# Patient Record
Sex: Male | Born: 2000 | Race: White | Hispanic: No | Marital: Single
Health system: Midwestern US, Academic
[De-identification: ages and names within clinical notes are randomized; demographics above are authoritative.]

## PROBLEM LIST (undated history)

## (undated) DIAGNOSIS — F32A Depression, unspecified: Secondary | ICD-10-CM

## (undated) DIAGNOSIS — F429 Obsessive-compulsive disorder, unspecified: Secondary | ICD-10-CM

## (undated) DIAGNOSIS — F419 Anxiety disorder, unspecified: Secondary | ICD-10-CM

## (undated) DIAGNOSIS — T7840XA Allergy, unspecified, initial encounter: Secondary | ICD-10-CM

## (undated) DIAGNOSIS — F909 Attention-deficit hyperactivity disorder, unspecified type: Secondary | ICD-10-CM

## (undated) DIAGNOSIS — F1910 Other psychoactive substance abuse, uncomplicated: Secondary | ICD-10-CM

## (undated) DIAGNOSIS — R569 Unspecified convulsions: Secondary | ICD-10-CM

## (undated) HISTORY — DX: Allergy, unspecified, initial encounter: T78.40XA

## (undated) HISTORY — PX: URETHRA SURGERY: SHX824

## (undated) HISTORY — DX: Depression, unspecified: F32.A

## (undated) HISTORY — DX: Other psychoactive substance abuse, uncomplicated: F19.10

## (undated) HISTORY — DX: Unspecified convulsions: R56.9

---
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
## 2020-03-13 ENCOUNTER — Ambulatory Visit (INDEPENDENT_AMBULATORY_CARE_PROVIDER_SITE_OTHER): Payer: 59 | Admitting: Neurology

## 2020-03-13 ENCOUNTER — Other Ambulatory Visit: Payer: Self-pay

## 2020-03-13 ENCOUNTER — Encounter: Payer: Self-pay | Admitting: Neurology

## 2020-03-13 VITALS — BP 121/82 | HR 74 | Resp 18 | Ht 67.0 in | Wt 176.0 lb

## 2020-03-13 DIAGNOSIS — R569 Unspecified convulsions: Secondary | ICD-10-CM | POA: Diagnosis not present

## 2020-03-13 NOTE — Progress Notes (Signed)
NEUROLOGY FOLLOW UP OFFICE NOTE  Edward Hardin [id number] 04-Jan-2001  HISTORY OF PRESENT ILLNESS: I had the pleasure of seeing Edward Hardin in follow-up in the neurology clinic on 03/13/2020.  The patient was last seen on 4 months ago for new onset seizure after a fall last 11/16/19. He is accompanied by his mother who helps supplement the history today.  Records and images were personally reviewed where available.  I personally reviewed MRI brain with and without contrast done 12/2019 which was normal, hippocampi symmetric with no abnormal signal or enhancement seen. His 1-hour wake and sleep EEG was normal. He has been doing well with no further seizures or seizure-like symptoms since 11/2019. He and his mother deny any staring/unresponsive episodes, gaps in time, olfactory/gustatory hallucinations, focal numbness/tingling/weakness, myoclonic jerks. No headaches, dizziness, vision changes, no further falls.   History on Initial Assessment 11/21/2019: This is a pleasant 19 year old man with a history of anxiety, ADHD, presenting for evaluation of new onset seizure. He was skateboarding last 11/16/19 and recalls he was feeling tired/fatigued. He apparently fell down while skateboarding and was told by his friend to sit down. His friend told him he hit his head but he did not think so. After he sat down, he recalls feeling dizzy looking at his injured right hand, then lost consciousness in the grass and was told his body started shaking with apnea. No tongue bite or incontinence. When EMS arrived, there were no further convulsions but he was noted to have a fixed gaze and needed assisted ventilations. He woke up in the ambulance and recalls feeling confused, no focal weakness. Per EMS he was alert and oriented for the rest of the trip to the ER. He reported he had not been eating well recently and was not sleeping well because he was forgetting to take his Trazodone. He denies any alcohol intake. He denies  any illicit drug use. He was in the ER in February 2021 after drug overdose with Xanax likely laced with Fentanyl. He had apparently been sober for 8 months until that day. He was admitted to inpatient Psychiatry at that time. He states mood is fine and denies any Xanax intake. No UDS done in the ER last week. He denies any prior history of seizures. Levetiracetam is on his medication list but he is not taking it. It appears to have been prescribed in March, but there is no documentation available on who/why this was prescribed, it was not on his medication list in the ER last week and was not prescribed at that time either. He denies any staring/unresponsive episodes, gaps in time, olfactory/gustatory hallucinations, deja vu, rising epigastric sensation, focal numbness/tingling/weakness, myoclonic jerks. He denies any headaches, dizziness, vision changes, neck/back pain, bowel/bladder dysfunction. Memory is all right, he is trying to finish up his senior year and needs to take a few summer classes to finish. He was born 2 months premature, he had a normal early development.  There is no history of febrile convulsions, CNS infections such as meningitis/encephalitis, significant traumatic brain injury, neurosurgical procedures, or family history of seizures.   PAST MEDICAL HISTORY: Past Medical History:  Diagnosis Date  . ADHD   . Anxiety   . OCD (obsessive compulsive disorder)     MEDICATIONS: Current Outpatient Medications on File Prior to Visit  Medication Sig Dispense Refill  . cloNIDine (CATAPRES) 0.1 MG tablet Take 0.1 mg by mouth 3 (three) times daily as needed.    Marland Kitchen EPINEPHrine (AUVI-Q) 0.3 mg/0.3  mL IJ SOAJ injection Inject 0.3 mLs (0.3 mg total) into the muscle once for 1 dose. 0.3 mL 0  . levalbuterol (XOPENEX HFA) 45 MCG/ACT inhaler Inhale 2 puffs into the lungs every 6 (six) hours as needed for wheezing. (Patient not taking: Reported on 11/21/2019) 1 Inhaler 11  . traZODone (DESYREL) 50  MG tablet Take 50 mg by mouth at bedtime as needed.    . tretinoin (RETIN-A) 0.025 % cream Apply topically at bedtime.     No current facility-administered medications on file prior to visit.    ALLERGIES: Allergies  Allergen Reactions  . Ciprofloxacin   . Other     Blueberries, tree nuts, honey  . Penicillins     FAMILY HISTORY: Family History  Problem Relation Age of Onset  . Ovarian cancer Maternal Grandmother   . Diabetes Mellitus II Maternal Grandfather     SOCIAL HISTORY: Social History   Socioeconomic History  . Marital status: Single    Spouse name: Not on file  . Number of children: Not on file  . Years of education: Not on file  . Highest education level: Not on file  Occupational History  . Not on file  Tobacco Use  . Smoking status: Never Smoker  . Smokeless tobacco: Never Used  Vaping Use  . Vaping Use: Every day  Substance and Sexual Activity  . Alcohol use: No  . Drug use: Yes    Types: Marijuana  . Sexual activity: Not on file  Other Topics Concern  . Not on file  Social History Narrative  . Not on file   Social Determinants of Health   Financial Resource Strain:   . Difficulty of Paying Living Expenses: Not on file  Food Insecurity:   . Worried About Programme researcher, broadcasting/film/video in the Last Year: Not on file  . Ran Out of Food in the Last Year: Not on file  Transportation Needs:   . Lack of Transportation (Medical): Not on file  . Lack of Transportation (Non-Medical): Not on file  Physical Activity:   . Days of Exercise per Week: Not on file  . Minutes of Exercise per Session: Not on file  Stress:   . Feeling of Stress : Not on file  Social Connections:   . Frequency of Communication with Friends and Family: Not on file  . Frequency of Social Gatherings with Friends and Family: Not on file  . Attends Religious Services: Not on file  . Active Member of Clubs or Organizations: Not on file  . Attends Banker Meetings: Not on file    . Marital Status: Not on file  Intimate Partner Violence:   . Fear of Current or Ex-Partner: Not on file  . Emotionally Abused: Not on file  . Physically Abused: Not on file  . Sexually Abused: Not on file     PHYSICAL EXAM: Vitals:   03/13/20 1411  BP: 121/82  Pulse: 74  Resp: 18  SpO2: 97%   General: No acute distress Head:  Normocephalic/atraumatic Skin/Extremities: No rash, no edema Neurological Exam: alert and oriented to person, place, and time. No aphasia or dysarthria. Fund of knowledge is appropriate.  Recent and remote memory are intact.  Attention and concentration are normal.  Cranial nerves: Pupils equal, round. Extraocular movements intact with no nystagmus. Visual fields full.  No facial asymmetry.  Motor: Bulk and tone normal, muscle strength 5/5 throughout with no pronator drift.  Finger to nose testing intact.  Gait narrow-based  and steady, able to tandem walk adequately.     IMPRESSION: This is a pleasant 19 yo RH man with a history of anxiety, ADHD, with new onset seizure last 11/16/19 after a fall. MRI brain and 1-hour EEG were normal. No further seizures or seizure-like symptoms since 11/2019. Seizure may have been post-traumatic after mild head injury and does not necessitate antiepileptic treatment. We again discussed that after an initial seizure, unless there are significant risk factors, an abnormal neurological exam, an EEG showing epileptiform abnormalities, and/or abnormal neuroimaging, treatment with an antiepileptic drug is not indicated. The patient was instructed to call with recurrent events, as in that case treatment and further diagnostic workup may be indicated. We discussed avoidance of seizure triggers, including sleep deprivation, alcohol, illicit drug use. He is aware of [place] driving restrictions to stop driving until 6 months seizure-free. Follow-up as needed, they know to call for any changes.   Thank you for allowing me to participate in his care.   Please do not hesitate to call for any questions or concerns.   Patrcia Dolly, M.D.   CC: Dr. Artis Flock

## 2020-03-13 NOTE — Patient Instructions (Signed)
Good to see you! Glad to hear you are doing well. Follow-up as needed, call for any changes.   Seizure Precautions: 1. If medication has been prescribed for you to prevent seizures, take it exactly as directed.  Do not stop taking the medicine without talking to your doctor first, even if you have not had a seizure in a long time.   2. Avoid activities in which a seizure would cause danger to yourself or to others.  Don't operate dangerous machinery, swim alone, or climb in high or dangerous places, such as on ladders, roofs, or girders.  Do not drive unless your doctor says you may.  3. If you have any warning that you may have a seizure, lay down in a safe place where you can't hurt yourself.    4.  No driving for 6 months from last seizure, as per [facility].   Please refer to the following link on the Epile[employer] of [employer]'s website for more information: http://www.epilepsyfoundation.org/answerplace/Social/driving/drivingu.cfm   5.  Maintain good sleep hygiene. Avoid alcohol.  6.  Contact your doctor if you have any problems that may be related to the medicine you are taking.  7.  Call 911 and bring the patient back to the ED if:        A.  The seizure lasts longer than 5 minutes.       B.  The patient doesn't awaken shortly after the seizure  C.  The patient has new problems such as difficulty seeing, speaking or moving  D.  The patient was injured during the seizure  E.  The patient has a temperature over 102 F (39C)  F.  The patient vomited and now is having trouble breathing

## 2020-05-26 ENCOUNTER — Other Ambulatory Visit: Payer: Self-pay

## 2020-05-26 ENCOUNTER — Encounter (HOSPITAL_BASED_OUTPATIENT_CLINIC_OR_DEPARTMENT_OTHER): Payer: Self-pay | Admitting: Emergency Medicine

## 2020-05-26 ENCOUNTER — Emergency Department (HOSPITAL_BASED_OUTPATIENT_CLINIC_OR_DEPARTMENT_OTHER)
Admission: EM | Admit: 2020-05-26 | Discharge: 2020-05-26 | Disposition: A | Discharge status: Home / Self Care | Payer: 59 | Attending: Emergency Medicine | Admitting: Emergency Medicine

## 2020-05-26 DIAGNOSIS — W268XXA Contact with other sharp object(s), not elsewhere classified, initial encounter: Secondary | ICD-10-CM | POA: Insufficient documentation

## 2020-05-26 DIAGNOSIS — Z23 Encounter for immunization: Secondary | ICD-10-CM | POA: Diagnosis not present

## 2020-05-26 DIAGNOSIS — S61213A Laceration without foreign body of left middle finger without damage to nail, initial encounter: Secondary | ICD-10-CM | POA: Diagnosis not present

## 2020-05-26 DIAGNOSIS — J4520 Mild intermittent asthma, uncomplicated: Secondary | ICD-10-CM | POA: Diagnosis not present

## 2020-05-26 DIAGNOSIS — S6992XA Unspecified injury of left wrist, hand and finger(s), initial encounter: Secondary | ICD-10-CM | POA: Diagnosis present

## 2020-05-26 MED ORDER — LIDOCAINE HCL 2 % IJ SOLN
10.0000 mL | Freq: Once | INTRAMUSCULAR | Status: AC
  Administered 2020-05-26: 200 mg via INTRADERMAL
  Filled 2020-05-26: qty 20

## 2020-05-26 MED ORDER — ACETAMINOPHEN 325 MG PO TABS
650.0000 mg | ORAL_TABLET | Freq: Once | ORAL | Status: AC
  Administered 2020-05-26: 650 mg via ORAL
  Filled 2020-05-26: qty 2

## 2020-05-26 MED ORDER — TETANUS-DIPHTH-ACELL PERTUSSIS 5-2.5-18.5 LF-MCG/0.5 IM SUSY
0.5000 mL | PREFILLED_SYRINGE | Freq: Once | INTRAMUSCULAR | Status: AC
  Administered 2020-05-26: 0.5 mL via INTRAMUSCULAR
  Filled 2020-05-26: qty 0.5

## 2020-05-26 NOTE — Discharge Instructions (Signed)
Please follow-up for suture removal at either urgent care ,the emergency department, or your primary care doctor in 7-10 days.  Please return to the emergency room immediately if you experience any new or worsening symptoms or any symptoms that indicate worsening infection such as fevers, increased redness/swelling/pain, warmth, or drainage from the affected area.   

## 2020-05-26 NOTE — ED Triage Notes (Signed)
Pt c/o laceration to left middle finger while doing dishes today.

## 2020-05-26 NOTE — ED Provider Notes (Signed)
[facility] EMERGENCY DEPARTMENT Provider Note   CSN: [id number] Arrival date & time: 05/26/20  1326     History Chief Complaint  Patient presents with  . Finger Injury    Edward Hardin is a 19 y.o. male.  HPI   19 y/o male with a h/o ADHD, anxiety, OCD who presents to the ED today for eval of finger laceration. States he was washing dishes pta when he cut his middle finger while washing dishes. Laceration located to the tip of the left middle finger. Normal sensation throughout. Normal rom. tdap 5 years ago.   Past Medical History:  Diagnosis Date  . ADHD   . Anxiety   . OCD (obsessive compulsive disorder)     Patient Active Problem List   Diagnosis Date Noted  . Vitamin D deficiency 11/22/2019  . Asthma, mild intermittent 06/17/2018  . Drug abuse (HCC) 06/17/2018  . Urethral obstruction 05/16/2018  . Keratosis pilaris 04/15/2018  . ADHD 01/13/2018  . Anxiety 01/13/2018  . OCD (obsessive compulsive disorder) 01/13/2018    Past Surgical History:  Procedure Laterality Date  . 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 Ovarian cancer Maternal Grandmother   . Diabetes Mellitus II Maternal Grandfather     Social History   Tobacco Use  . Smoking status: Never Smoker  . Smokeless tobacco: Never Used  Vaping Use  . Vaping Use: Every day  Substance Use Topics  . Alcohol use: No  . Drug use: Yes    Types: Marijuana    Home Medications Prior to Admission medications   Medication Sig Start Date End Date Taking? Authorizing Provider  cloNIDine (CATAPRES) 0.1 MG tablet Take 0.1 mg by mouth 3 (three) times daily as needed. Patient not taking: Reported on 03/13/2020 10/03/19   [provider]  EPINEPHrine (AUVI-Q) 0.3 mg/0.3 mL IJ SOAJ injection Inject 0.3 mLs (0.3 mg total) into the muscle once for 1 dose. 04/21/19 11/22/19  Orland Mustard, MD  levalbuterol [facility] HFA) 45  MCG/ACT inhaler Inhale 2 puffs into the lungs every 6 (six) hours as needed for wheezing. Patient not taking: Reported on 11/21/2019 04/10/19   Orland Mustard, MD  traZODone (DESYREL) 50 MG tablet Take 50 mg by mouth at bedtime as needed. Patient not taking: Reported on 03/13/2020 10/16/19   [provider]  tretinoin (RETIN-A) 0.025 % cream Apply topically at bedtime. Patient not taking: Reported on 03/13/2020    [provider]    Allergies    Ciprofloxacin, Other, and Penicillins  Review of Systems   Review of Systems  Constitutional: Negative for fever.  Musculoskeletal:       Finger pain/wound  Skin: Positive for wound.  Neurological: Negative for weakness and numbness.    Physical Exam Updated Vital Signs BP 125/70 (BP Location: Right Arm)   Pulse 67   Temp (!) 97.5 F (36.4 C) (Oral)   Resp 18   Ht 5\' 8"  (1.727 m)   Wt 73.9 kg   SpO2 99%   BMI 24.78 kg/m   Physical Exam Constitutional:      General: He is not in acute distress.    Appearance: He is well-developed.  Eyes:     Conjunctiva/sclera: Conjunctivae normal.  Cardiovascular:     Rate and Rhythm: Normal rate.  Pulmonary:     Effort: Pulmonary effort is normal.  Musculoskeletal:  Comments: 1cm shaped laceration to the left middle finger. Brisk cap refill distally. Normal distal sensation and ROM.   Skin:    General: Skin is warm and dry.  Neurological:     Mental Status: He is alert and oriented to person, place, and time.     ED Results / Procedures / Treatments   Labs (all labs ordered are listed, but only abnormal results are displayed) Labs Reviewed - No data to display  EKG None  Radiology No results found.  Procedures .Marland KitchenLaceration Repair  Date/Time: 05/26/2020 3:25 PM Performed by: Karrie Meres, PA-C Authorized by: Karrie Meres, PA-C   Consent:    Consent obtained:  Verbal   Consent given by:  Patient   Risks discussed:  Infection and pain    Alternatives discussed:  No treatment Anesthesia (see MAR for exact dosages):    Anesthesia method:  Nerve block   Block needle gauge:  25 G   Block anesthetic:  Lidocaine 1% w/o epi   Block injection procedure:  Anatomic landmarks identified, introduced needle, incremental injection, negative aspiration for blood and anatomic landmarks palpated   Block outcome:  Anesthesia achieved Laceration details:    Location:  Finger   Finger location:  L long finger   Length (cm):  1 Repair type:    Repair type:  Simple Pre-procedure details:    Preparation:  Patient was prepped and draped in usual sterile fashion and imaging obtained to evaluate for foreign bodies Exploration:    Hemostasis achieved with:  Tourniquet and direct pressure   Wound exploration: wound explored through full range of motion and entire depth of wound probed and visualized     Contaminated: no   Treatment:    Area cleansed with:  Saline and Hibiclens   Amount of cleaning:  Extensive   Irrigation solution:  Sterile saline   Irrigation volume:  1L   Irrigation method:  Pressure wash   Visualized foreign bodies/material removed: no   Skin repair:    Repair method:  Sutures   Suture size:  5-0   Suture material:  Prolene   Suture technique:  Simple interrupted   Number of sutures:  3 Approximation:    Approximation:  Close Post-procedure details:    Dressing:  Tube gauze   Patient tolerance of procedure:  Tolerated well, no immediate complications   (including critical care time)  Medications Ordered in ED Medications  Tdap (BOOSTRIX) injection 0.5 mL (0.5 mLs Intramuscular Given 05/26/20 1403)  lidocaine (XYLOCAINE) 2 % (with pres) injection 200 mg (200 mg Intradermal Given by Other 05/26/20 1404)  acetaminophen (TYLENOL) tablet 650 mg (650 mg Oral Given 05/26/20 1402)    ED Course  I have reviewed the triage vital signs and the nursing notes.  Pertinent labs & imaging results that were available during  my care of the patient were reviewed by me and considered in my medical decision making (see chart for details).    MDM Rules/Calculators/A&P                          Pressure irrigation performed. Wound explored and base of wound visualized in a bloodless field without evidence of foreign body.  Laceration occurred < 8 hours prior to repair which was well tolerated. Tdap updated.  Pt has  no comorbidities to effect normal wound healing. Pt discharged  without antibiotics.  Discussed suture home care with patient and answered questions. Pt to follow-up for  wound check and suture removal in 7 days; they are to return to the ED sooner for signs of infection. Pt is hemodynamically stable with no complaints prior to dc.   Final Clinical Impression(s) / ED Diagnoses Final diagnoses:  Laceration of left middle finger without foreign body without damage to nail, initial encounter    Rx / DC Orders ED Discharge Orders    None       Karrie Meres, PA-C 05/26/20 1526    Arby Barrette, MD 05/27/20 [phone]

## 2020-06-03 ENCOUNTER — Ambulatory Visit (INDEPENDENT_AMBULATORY_CARE_PROVIDER_SITE_OTHER): Payer: 59 | Admitting: Family Medicine

## 2020-06-03 ENCOUNTER — Other Ambulatory Visit: Payer: Self-pay

## 2020-06-03 ENCOUNTER — Encounter: Payer: Self-pay | Admitting: Family Medicine

## 2020-06-03 VITALS — BP 110/70 | HR 85 | Temp 98.3°F | Wt 166.4 lb

## 2020-06-03 DIAGNOSIS — S61219A Laceration without foreign body of unspecified finger without damage to nail, initial encounter: Secondary | ICD-10-CM | POA: Diagnosis not present

## 2020-06-03 DIAGNOSIS — S61219S Laceration without foreign body of unspecified finger without damage to nail, sequela: Secondary | ICD-10-CM

## 2020-06-03 MED ORDER — MUPIROCIN 2 % EX OINT: 1.0000 "application " | TOPICAL_OINTMENT | Freq: Two times a day (BID) | CUTANEOUS | 0 refills | Status: DC

## 2020-06-03 NOTE — Progress Notes (Signed)
Patient: Edward Hardin MRN: [id number] DOB: 2000/12/13 PCP: Edward Mustard, MD     Subjective:  Chief Complaint  Patient presents with   Suture / Staple Removal    left middle finger, sliced finger while cleaning a blender, stitches placed 9 days ago    HPI: The patient is a 19 y.o. male who presents today for suture removal. He sliced his left middle finger 9 days ago while cleaning a blender. Went to [place] ER on 11/21 and had cut sutured. Per ER note he had a 1cm laceration to the left middle finger iwht normal cap refill. Normal sensation. Was given tdap in ER on 05/26/20. 3 simple interrupted sutures placed. He denies any drainage, pain or bleeding from site. Has full rom and movement. Has decreased sensation at tip, but no numbness.   Er notes reviewed.   Review of Systems  Constitutional: Negative for chills, fatigue and fever.  Respiratory: Negative for cough and shortness of breath.   Cardiovascular: Negative for chest pain and palpitations.  Skin: Positive for wound. Negative for color change and rash.  Neurological: Negative for seizures and numbness.    Allergies Patient is allergic to ciprofloxacin, other, and penicillins.  Past Medical History Patient  has a past medical history of ADHD, Anxiety, and OCD (obsessive compulsive disorder).  Surgical History Patient  has a past surgical history that includes Urethra surgery.  Family History Pateint's family history includes Diabetes Mellitus II in his maternal grandfather; Ovarian cancer in his maternal grandmother.  Social History Patient  reports that he has never smoked. He has never used smokeless tobacco. He reports current drug use. Drug: Marijuana. He reports that he does not drink alcohol.    Objective: Vitals:   06/03/20 1411  BP: 110/70  Pulse: 85  Temp: 98.3 F (36.8 C)  TempSrc: Temporal  SpO2: 97%  Weight: 166 lb 6.4 oz (75.5 kg)    Body mass index is 25.3 kg/m.  Physical  Exam Vitals reviewed.  Constitutional:      Appearance: Normal appearance. He is normal weight.  HENT:     Head: Normocephalic and atraumatic.  Pulmonary:     Effort: Pulmonary effort is normal.  Skin:    Comments: Left third finger: tip of finger has 3 simple interrupted sutures. No drainage/bleeding. No edema or erythema.   Neurological:     Mental Status: He is alert.    Procedure note Verbal consent obtained to remove sutures. Sutures easily removed with forceps and scissors. Tolerated well. Cleaned with alcohol and bandaid applied. Wound care given.     Assessment/plan: 1. Finger laceration, sequela Well healed. Sutures removed. Tolerated well. bactroban to area bid for a few days and then is good. Suture removal instructions given.     This visit occurred during the SARS-CoV-2 public health emergency.  Safety protocols were in place, including screening questions prior to the visit, additional usage of staff PPE, and extensive cleaning of exam room while observing appropriate contact time as indicated for disinfecting solutions.     Return if symptoms worsen or fail to improve.     Edward Mustard, MD [facility]  06/03/2020

## 2020-06-03 NOTE — Patient Instructions (Signed)
-bactroban twice a day for a few week.  Looks good.    Suture Removal, Care After This sheet gives you information about how to care for yourself after your procedure. Your health care provider may also give you more specific instructions. If you have problems or questions, contact your health care provider. What can I expect after the procedure? After your stitches (sutures) are removed, it is common to have:  Some discomfort and swelling in the area.  Slight redness in the area. Follow these instructions at home: If you have a bandage:  Wash your hands with soap and water before you change your bandage (dressing). If soap and water are not available, use hand sanitizer.  Change your dressing as told by your health care provider. If your dressing becomes wet or dirty, or develops a bad smell, change it as soon as possible.  If your dressing sticks to your skin, soak it in warm water to loosen it. Wound care   Check your wound every day for signs of infection. Check for: ? More redness, swelling, or pain. ? Fluid or blood. ? Warmth. ? Pus or a bad smell.  Wash your hands with soap and water before and after touching your wound.  Apply cream or ointment only as directed by your health care provider. If you are using cream or ointment, wash the area with soap and water 2 times a day to remove all the cream or ointment. Rinse off the soap and pat the area dry with a clean towel.  If you have skin glue or adhesive strips on your wound, leave these closures in place. They may need to stay in place for 2 weeks or longer. If adhesive strip edges start to loosen and curl up, you may trim the loose edges. Do not remove adhesive strips completely unless your health care provider tells you to do that.  Keep the wound area dry and clean. Do not take baths, swim, or use a hot tub until your health care provider approves.  Continue to protect the wound from injury.  Do not pick at your  wound. Picking can cause an infection.  When your wound has completely healed, wear sunscreen over it or cover it with clothing when you are outside. New scars get sunburned easily, which can make scarring worse. General instructions  Take over-the-counter and prescription medicines only as told by your health care provider.  Keep all follow-up visits as told by your health care provider. This is important. Contact a health care provider if:  You have redness, swelling, or pain around your wound.  You have fluid or blood coming from your wound.  Your wound feels warm to the touch.  You have pus or a bad smell coming from your wound.  Your wound opens up. Get help right away if:  You have a fever.  You have redness that is spreading from your wound. Summary  After your sutures are removed, it is common to have some discomfort and swelling in the area.  Wash your hands with soap and water before you change your bandage (dressing).  Keep the wound area dry and clean. Do not take baths, swim, or use a hot tub until your health care provider approves. This information is not intended to replace advice given to you by your health care provider. Make sure you discuss any questions you have with your health care provider. Document Revised: 06/04/2017 Document Reviewed: 07/28/2016 Elsevier Patient Education  2020 [employer].

## 2020-07-19 ENCOUNTER — Other Ambulatory Visit: Payer: Self-pay

## 2020-07-19 ENCOUNTER — Encounter (HOSPITAL_COMMUNITY): Payer: Self-pay | Admitting: *Deleted

## 2020-07-19 ENCOUNTER — Ambulatory Visit (HOSPITAL_COMMUNITY)
Admission: EM | Admit: 2020-07-19 | Discharge: 2020-07-19 | Disposition: A | Discharge status: Home / Self Care | Payer: 59

## 2020-07-19 DIAGNOSIS — T148XXA Other injury of unspecified body region, initial encounter: Secondary | ICD-10-CM | POA: Diagnosis not present

## 2020-07-19 NOTE — ED Provider Notes (Signed)
[facility]    CSN: [id number] Arrival date & time: 07/19/20  1730      History   Chief Complaint Chief Complaint  Patient presents with  . Leg Pain    Pain for 2 months    HPI Edward Hardin is a 20 y.o. male.   Pt complains of right lower leg pain that started about 2 months ago.  He only feels the pain when running.  Denies injury or trauma.  Rest seems to make the pain better.  Denies swelling, redness, warmth, numbness, tingling.       Past Medical History:  Diagnosis Date  . ADHD   . Anxiety   . OCD (obsessive compulsive disorder)     Patient Active Problem List   Diagnosis Date Noted  . Vitamin D deficiency 11/22/2019  . Asthma, mild intermittent 06/17/2018  . Drug abuse (HCC) 06/17/2018  . Urethral obstruction 05/16/2018  . Keratosis pilaris 04/15/2018  . ADHD 01/13/2018  . Anxiety 01/13/2018  . OCD (obsessive compulsive disorder) 01/13/2018    Past Surgical History:  Procedure Laterality Date  . URETHRA SURGERY     Blocked urethra - no clear cause       Home Medications    Prior to Admission medications   Medication Sig Start Date End Date Taking? Authorizing Provider  traZODone (DESYREL) 50 MG tablet Take 50 mg by mouth at bedtime as needed.  10/16/19  Yes [provider]  cloNIDine (CATAPRES) 0.1 MG tablet Take 0.1 mg by mouth 3 (three) times daily as needed.  Patient not taking: Reported on 06/03/2020 10/03/19   [provider]  EPINEPHrine (AUVI-Q) 0.3 mg/0.3 mL IJ SOAJ injection Inject 0.3 mLs (0.3 mg total) into the muscle once for 1 dose. 04/21/19 06/03/20  Orland Mustard, MD  levalbuterol [facility] HFA) 45 MCG/ACT inhaler Inhale 2 puffs into the lungs every 6 (six) hours as needed for wheezing. Patient not taking: Reported on 06/03/2020 04/10/19   Orland Mustard, MD  mupirocin ointment (BACTROBAN) 2 % Apply 1 application topically 2 (two) times daily. 06/03/20   Orland Mustard, MD  tretinoin (RETIN-A) 0.025 %  cream Apply topically at bedtime.     [provider]    Family History Family History  Problem Relation Age of Onset  . Ovarian cancer Maternal Grandmother   . Diabetes Mellitus II Maternal Grandfather     Social History Social History   Tobacco Use  . Smoking status: Never Smoker  . Smokeless tobacco: Never Used  Vaping Use  . Vaping Use: Every day  Substance Use Topics  . Alcohol use: No  . Drug use: Yes    Types: Marijuana     Allergies   Ciprofloxacin, Other, and Penicillins   Review of Systems Review of Systems  Constitutional: Negative for chills and fever.  HENT: Negative for ear pain and sore throat.   Eyes: Negative for pain and visual disturbance.  Respiratory: Negative for cough and shortness of breath.   Cardiovascular: Negative for chest pain and palpitations.  Gastrointestinal: Negative for abdominal pain and vomiting.  Genitourinary: Negative for dysuria and hematuria.  Musculoskeletal: Positive for myalgias (right lower leg pain). Negative for back pain.  Skin: Negative for color change and rash.  Neurological: Negative for seizures and syncope.  All other systems reviewed and are negative.    Physical Exam Triage Vital Signs ED Triage Vitals  Enc Vitals Group     BP 07/19/20 1826 (!) 99/50     Pulse  Rate 07/19/20 1826 81     Resp 07/19/20 1826 16     Temp 07/19/20 1826 98.2 F (36.8 C)     Temp Source 07/19/20 1826 Oral     SpO2 07/19/20 1826 100 %     Weight --      Height --      Head Circumference --      Peak Flow --      Pain Score 07/19/20 1829 3     Pain Loc --      Pain Edu? --      Excl. in GC? --    No data found.  Updated Vital Signs BP (!) 99/50 (BP Location: Right Arm)   Pulse 81   Temp 98.2 F (36.8 C) (Oral)   Resp 16   SpO2 100%   Visual Acuity Right Eye Distance:   Left Eye Distance:   Bilateral Distance:    Right Eye Near:   Left Eye Near:    Bilateral Near:     Physical Exam Vitals and  nursing note reviewed.  Constitutional:      Appearance: He is well-developed and well-nourished.  HENT:     Head: Normocephalic and atraumatic.  Eyes:     Conjunctiva/sclera: Conjunctivae normal.  Cardiovascular:     Rate and Rhythm: Normal rate and regular rhythm.     Heart sounds: No murmur heard.   Pulmonary:     Effort: Pulmonary effort is normal. No respiratory distress.     Breath sounds: Normal breath sounds.  Abdominal:     Palpations: Abdomen is soft.     Tenderness: There is no abdominal tenderness.  Musculoskeletal:        General: No edema.     Cervical back: Neck supple.     Right lower leg: Normal. No swelling, deformity, tenderness or bony tenderness. No edema.     Left lower leg: Normal.  Skin:    General: Skin is warm and dry.  Neurological:     Mental Status: He is alert.  Psychiatric:        Mood and Affect: Mood and affect normal.      UC Treatments / Results  Labs (all labs ordered are listed, but only abnormal results are displayed) Labs Reviewed - No data to display  EKG   Radiology No results found.  Procedures Procedures (including critical care time)  Medications Ordered in UC Medications - No data to display  Initial Impression / Assessment and Plan / UC Course  I have reviewed the triage vital signs and the nursing notes.  Pertinent labs & imaging results that were available during my care of the patient were reviewed by me and considered in my medical decision making (see chart for details).     Pt experiencing muscular pain, calf pain only when running.  No injury or trauma.  No pain today, no tenderness or abnormality on exam.  Recommend stretching routine, ibuprofen, ice/heat.  If no improvement follow up with orthopedics.  Final Clinical Impressions(s) / UC Diagnoses   Final diagnoses:  Muscle strain     Discharge Instructions     Recommend ice/heat to affected area Begin with daily stretching Can take Ibuprofen as  needed Follow up with orthopedics if no improvement.    ED Prescriptions    None     PDMP not reviewed this encounter.   Jodell Cipro, PA-C 07/19/20 1912

## 2020-07-19 NOTE — ED Triage Notes (Signed)
Pt reports a 2 month Hx of rt lower leg pain with out injury. Pt reports it hurts when he runs.

## 2020-07-19 NOTE — Discharge Instructions (Signed)
Recommend ice/heat to affected area Begin with daily stretching Can take Ibuprofen as needed Follow up with orthopedics if no improvement.

## 2020-12-18 ENCOUNTER — Other Ambulatory Visit: Payer: Self-pay

## 2020-12-18 ENCOUNTER — Emergency Department (HOSPITAL_COMMUNITY): Payer: 59

## 2020-12-18 ENCOUNTER — Emergency Department (HOSPITAL_COMMUNITY)
Admission: EM | Admit: 2020-12-18 | Discharge: 2020-12-18 | Disposition: A | Discharge status: Home / Self Care | Payer: 59 | Attending: Emergency Medicine | Admitting: Emergency Medicine

## 2020-12-18 DIAGNOSIS — S0993XA Unspecified injury of face, initial encounter: Secondary | ICD-10-CM | POA: Diagnosis present

## 2020-12-18 DIAGNOSIS — S01512A Laceration without foreign body of oral cavity, initial encounter: Secondary | ICD-10-CM | POA: Insufficient documentation

## 2020-12-18 DIAGNOSIS — F13239 Sedative, hypnotic or anxiolytic dependence with withdrawal, unspecified: Secondary | ICD-10-CM | POA: Diagnosis not present

## 2020-12-18 DIAGNOSIS — F13939 Sedative, hypnotic or anxiolytic use, unspecified with withdrawal, unspecified: Secondary | ICD-10-CM

## 2020-12-18 DIAGNOSIS — J4520 Mild intermittent asthma, uncomplicated: Secondary | ICD-10-CM | POA: Insufficient documentation

## 2020-12-18 DIAGNOSIS — W19XXXA Unspecified fall, initial encounter: Secondary | ICD-10-CM | POA: Diagnosis not present

## 2020-12-18 LAB — CBC WITH DIFFERENTIAL/PLATELET
Abs Immature Granulocytes: 0.03 10*3/uL (ref 0.00–0.07)
Basophils Absolute: 0 10*3/uL (ref 0.0–0.1)
Basophils Relative: 0 %
Eosinophils Absolute: 0 10*3/uL (ref 0.0–0.5)
Eosinophils Relative: 0 %
HCT: 49.2 % (ref 39.0–52.0)
Hemoglobin: 16.8 g/dL (ref 13.0–17.0)
Immature Granulocytes: 0 %
Lymphocytes Relative: 18 %
Lymphs Abs: 1.3 10*3/uL (ref 0.7–4.0)
MCH: 30.2 pg (ref 26.0–34.0)
MCHC: 34.1 g/dL (ref 30.0–36.0)
MCV: 88.5 fL (ref 80.0–100.0)
Monocytes Absolute: 0.4 10*3/uL (ref 0.1–1.0)
Monocytes Relative: 5 %
Neutro Abs: 5.3 10*3/uL (ref 1.7–7.7)
Neutrophils Relative %: 77 %
Platelets: 278 10*3/uL (ref 150–400)
RBC: 5.56 MIL/uL (ref 4.22–5.81)
RDW: 11.9 % (ref 11.5–15.5)
WBC: 7.1 10*3/uL (ref 4.0–10.5)
nRBC: 0 % (ref 0.0–0.2)

## 2020-12-18 LAB — BASIC METABOLIC PANEL
Anion gap: 9 (ref 5–15)
BUN: 14 mg/dL (ref 6–20)
CO2: 23 mmol/L (ref 22–32)
Calcium: 9.3 mg/dL (ref 8.9–10.3)
Chloride: 106 mmol/L (ref 98–111)
Creatinine, Ser: 0.82 mg/dL (ref 0.61–1.24)
GFR, Estimated: >60 mL/min (ref >60)
Glucose, Bld: 115 mg/dL — ABNORMAL HIGH (ref 70–99)
Potassium: 4.5 mmol/L (ref 3.5–5.1)
Sodium: 138 mmol/L (ref 135–145)

## 2020-12-18 LAB — ETHANOL: Alcohol, Ethyl (B): <10 mg/dL (ref <10)

## 2020-12-18 LAB — CBG MONITORING, ED: Glucose-Capillary: 89 mg/dL (ref 70–99)

## 2020-12-18 MED ORDER — LORAZEPAM 2 MG/ML IJ SOLN: 1.0000 mg | Freq: Once | INTRAMUSCULAR | Status: DC | PRN

## 2020-12-18 MED ORDER — IBUPROFEN 800 MG PO TABS
800.0000 mg | ORAL_TABLET | Freq: Once | ORAL | Status: AC
  Administered 2020-12-18: 800 mg via ORAL
  Filled 2020-12-18: qty 1

## 2020-12-18 MED ORDER — SODIUM CHLORIDE 0.9 % IV BOLUS
1000.0000 mL | Freq: Once | INTRAVENOUS | Status: AC
  Administered 2020-12-18: 1000 mL via INTRAVENOUS

## 2020-12-18 MED ORDER — SODIUM CHLORIDE 0.9 % IV SOLN: INTRAVENOUS | Status: DC

## 2020-12-18 MED ORDER — CHLORDIAZEPOXIDE HCL 25 MG PO CAPS: ORAL_CAPSULE | ORAL | 0 refills | Status: AC

## 2020-12-18 MED ORDER — ACETAMINOPHEN 500 MG PO TABS
1000.0000 mg | ORAL_TABLET | Freq: Once | ORAL | Status: AC
  Administered 2020-12-18: 1000 mg via ORAL
  Filled 2020-12-18: qty 2

## 2020-12-18 NOTE — ED Triage Notes (Signed)
Patient presents to ED via [facility] for seizures.  Witnessed seizure by mother that lasted approximately 3 minutes, possible tonic clonic. Last seizure was 9 months ago.   BP: 119/80 HR: 92 SpO2: 99 % RA CBG 86

## 2020-12-18 NOTE — ED Notes (Signed)
Patient transported to X-ray 

## 2020-12-18 NOTE — ED Provider Notes (Signed)
[facility] EMERGENCY DEPARTMENT Provider Note   CSN: [id number] Arrival date & time: 12/18/20  1250     History Chief Complaint  Patient presents with   Seizures    Jaimeson Gopal is a 20 y.o. male.  The history is provided by the patient and medical records. No language interpreter was used.  Seizures  20 year old male significant history of OCD, anxiety, drug abuse, ADHD presents ED for evaluation of seizure activity.  Per EMS, patient was brought from home after he had a witnessed seizure episode by his mother which lasted for approximately 3 minutes, with possible tonic-clonic movement.  Patient last seizure was approximately 9 months ago. Patient had a new onset seizure after fall last 11/16/2019.  He was seen evaluate by neurology and had had a normal brain MRI with normal EEG.  Today he admits that his seizure may be due to benzo withdrawal.  Patient mention that he has been taking Xanax, unknown amount, daily for the past week but have not been taking it for the past 2 days.  He did recall feeling symptoms withdrawal which include shakiness and feeling anxious yesterday prior to his episode.  Patient also admits to polysubstance use including heroin, and cocaine use but last use was more than a year ago.  He endorsed occasional alcohol use but have not used it for the past several months.  He denies depressed, denies SI or HI.  Aside from mild headache he denies any chest pain trouble breathing abdominal pain back pain or pain to his extremities.  Denies any focal numbness or focal weakness or confusion.  Denies tongue biting or urinary or bowel incontinence.  Patient lives at home with his parent.  Past Medical History:  Diagnosis Date   ADHD    Anxiety    OCD (obsessive compulsive disorder)     Patient Active Problem List   Diagnosis Date Noted   Vitamin D deficiency 11/22/2019   Asthma, mild intermittent 06/17/2018   Drug abuse (HCC) 06/17/2018    Urethral obstruction 05/16/2018   Keratosis pilaris 04/15/2018   ADHD 01/13/2018   Anxiety 01/13/2018   OCD (obsessive compulsive disorder) 01/13/2018    Past Surgical History:  Procedure Laterality Date   URETHRA SURGERY     Blocked urethra - no clear cause       Family History  Problem Relation Age of Onset   Ovarian cancer Maternal Grandmother    Diabetes Mellitus II Maternal Grandfather     Social History   Tobacco Use   Smoking status: Never   Smokeless tobacco: Never  Vaping Use   Vaping Use: Every day  Substance Use Topics   Alcohol use: No   Drug use: Yes    Types: Marijuana    Home Medications Prior to Admission medications   Medication Sig Start Date End Date Taking? Authorizing Provider  cloNIDine (CATAPRES) 0.1 MG tablet Take 0.1 mg by mouth 3 (three) times daily as needed.  Patient not taking: Reported on 06/03/2020 10/03/19   [provider]  EPINEPHrine (AUVI-Q) 0.3 mg/0.3 mL IJ SOAJ injection Inject 0.3 mLs (0.3 mg total) into the muscle once for 1 dose. 04/21/19 06/03/20  Orland Mustard, MD  levalbuterol [facility] HFA) 45 MCG/ACT inhaler Inhale 2 puffs into the lungs every 6 (six) hours as needed for wheezing. Patient not taking: Reported on 06/03/2020 04/10/19   Orland Mustard, MD  mupirocin ointment (BACTROBAN) 2 % Apply 1 application topically 2 (two) times daily. 06/03/20  Orland Mustard, MD  traZODone (DESYREL) 50 MG tablet Take 50 mg by mouth at bedtime as needed.  10/16/19   [provider]  tretinoin (RETIN-A) 0.025 % cream Apply topically at bedtime.     [provider]    Allergies    Ciprofloxacin, Other, and Penicillins  Review of Systems   Review of Systems  Neurological:  Positive for seizures.  All other systems reviewed and are negative.  Physical Exam Updated Vital Signs BP 130/87 (BP Location: Left Arm)   Pulse 84   Temp 97.6 F (36.4 C) (Oral)   Resp 16   Ht 5\' 8"  (1.727 m)   Wt 69.9 kg   SpO2  100%   BMI 23.42 kg/m   Physical Exam Vitals and nursing note reviewed.  Constitutional:      General: He is not in acute distress.    Appearance: He is well-developed.  HENT:     Head: Normocephalic and atraumatic.     Comments: No scalp tenderness no battle signs, no raccoon's eyes, no midface tenderness.    Mouth/Throat:     Comments: Small laceration noted to right lateral tongue. Eyes:     Conjunctiva/sclera: Conjunctivae normal.  Cardiovascular:     Rate and Rhythm: Normal rate and regular rhythm.     Pulses: Normal pulses.     Heart sounds: Normal heart sounds.  Pulmonary:     Effort: Pulmonary effort is normal.     Breath sounds: Normal breath sounds.  Abdominal:     Palpations: Abdomen is soft.     Tenderness: There is no abdominal tenderness.  Musculoskeletal:     Cervical back: Normal range of motion and neck supple. No rigidity.     Comments: 5 out of 5 strength all 4 extremities  Skin:    Findings: No rash.  Neurological:     Mental Status: He is alert.     GCS: GCS eye subscore is 4. GCS verbal subscore is 5. GCS motor subscore is 6.     Cranial Nerves: Cranial nerves are intact.     Sensory: Sensation is intact.     Motor: Motor function is intact.     Coordination: Coordination is intact.  Psychiatric:        Mood and Affect: Mood normal.        Behavior: Behavior is cooperative.        Thought Content: Thought content does not include homicidal or suicidal ideation.    ED Results / Procedures / Treatments   Labs (all labs ordered are listed, but only abnormal results are displayed) Labs Reviewed  BASIC METABOLIC PANEL - Abnormal; Notable for the following components:      Result Value   Glucose, Bld 115 (*)    All other components within normal limits  CBC WITH DIFFERENTIAL/PLATELET  ETHANOL  RAPID URINE DRUG SCREEN, HOSP PERFORMED  CBG MONITORING, ED    EKG None  Radiology No results found.  Procedures Procedures   Medications  Ordered in ED Medications  sodium chloride 0.9 % bolus 1,000 mL (0 mLs Intravenous Stopped 12/18/20 1443)    And  0.9 %  sodium chloride infusion ( Intravenous New Bag/Given 12/18/20 1445)  LORazepam (ATIVAN) injection 1 mg (has no administration in time range)  acetaminophen (TYLENOL) tablet 1,000 mg (1,000 mg Oral Given 12/18/20 1328)    ED Course  I have reviewed the triage vital signs and the nursing notes.  Pertinent labs & imaging results that were  available during my care of the patient were reviewed by me and considered in my medical decision making (see chart for details).    MDM Rules/Calculators/A&P                          BP 110/66 (BP Location: Right Arm)   Pulse 60   Temp 97.6 F (36.4 C) (Oral)   Resp 16   Ht 5\' 8"  (1.727 m)   Wt 69.9 kg   SpO2 99%   BMI 23.42 kg/m   Final Clinical Impression(s) / ED Diagnoses Final diagnoses:  Benzodiazepine withdrawal with complication (HCC)    Rx / DC Orders ED Discharge Orders          Ordered    chlordiazePOXIDE (LIBRIUM) 25 MG capsule        12/18/20 1532           1:09 PM Patient with history of drug abuse brought here for witnessed seizure episode by mother earlier today.  Seizure may be due to benzodiazepine withdrawal since patient admits to regular Xanax use but have not been taking it for the past 2 days.  Remote history of seizure from traumatic head injury from falling off skateboard.  He was seen a year ago for that and has had negative EEG as well as negative brain MRI.  At this time he is mentating appropriately, no significant signs of injury aside from mild laceration to the corner of his does not require laceration repair.  Work-up initiated.  1:40 PM Ultimate plan is to discharge patient home with a benzodiazepine taper course with Librium.  Will educate patient to avoid substance use as it is greatly affecting his health.  Outpatient resource provided as well.  3:26 PM Prior to discharge, mom  voiced concern that patient has been complaining of his back pain and she worried him he may have injured his back from the seizure activity.  I have evaluated patient initially and did not have any reproducible back pain.  On reexamination he expressed pain to his lumbar spine.  No crepitus or step-off noted.  X-rays ordered.  If x-rays negative, patient can be discharged home with Librium taper.  He does have an appointment with his provider in 2 days.  3:40 PM Pt sign out to oncoming provider who will f/u on Xray result pending dispo.    12/20/20, PA-C 12/18/20 1541    12/20/20, MD 12/19/20 [phone]

## 2020-12-18 NOTE — ED Provider Notes (Signed)
Xray ls spine,  no fracture.     Edward Hardin 12/18/20 1653    Lorre Nick, MD 12/19/20 2252

## 2020-12-18 NOTE — Discharge Instructions (Signed)
You have been evaluated for your seizure activity likely related to withdrawal from benzodiazepine.  Please take medication prescribed to help with tapering from withdrawal.  Follow-up with your doctor in 2 days for further care.  Return if you have any concern.

## 2020-12-20 ENCOUNTER — Encounter: Payer: Self-pay | Admitting: Family

## 2020-12-20 ENCOUNTER — Telehealth (INDEPENDENT_AMBULATORY_CARE_PROVIDER_SITE_OTHER): Payer: 59 | Admitting: Family

## 2020-12-20 VITALS — Temp 97.8°F | Ht 68.0 in | Wt 157.0 lb

## 2020-12-20 DIAGNOSIS — F419 Anxiety disorder, unspecified: Secondary | ICD-10-CM | POA: Diagnosis not present

## 2020-12-20 DIAGNOSIS — F1910 Other psychoactive substance abuse, uncomplicated: Secondary | ICD-10-CM

## 2020-12-20 DIAGNOSIS — F429 Obsessive-compulsive disorder, unspecified: Secondary | ICD-10-CM

## 2020-12-22 NOTE — Progress Notes (Signed)
   Virtual Visit via Video   I connected with patient on 12/22/20 at  2:30 PM EDT by a video enabled telemedicine application and verified that I am speaking with the correct person using two identifiers.  Location patient: Home Location provider: [place]  I discussed the limitations of evaluation and management by telemedicine and the availability of in person appointments. The patient expressed understanding and agreed to proceed.  Subjective:   HPI:   20 year old male is being seen virtually as a hospital follow-up. He has a history of anxiety, OCD, and drug dependence. He was seen at the ER 6/15 and prescribed Librium. He reports feeling the most normal on Librium. Needs to see psychiatry for med management. Has seen a therapist at the [facility] in the past and plans to return there for therapy. Mother present and very supportive.  ROS:   See pertinent positives and negatives per HPI.  Patient Active Problem List   Diagnosis Date Noted   Vitamin D deficiency 11/22/2019   Asthma, mild intermittent 06/17/2018   Drug abuse (HCC) 06/17/2018   Urethral obstruction 05/16/2018   Keratosis pilaris 04/15/2018   ADHD 01/13/2018   Anxiety 01/13/2018   OCD (obsessive compulsive disorder) 01/13/2018    Social History   Tobacco Use   Smoking status: Never   Smokeless tobacco: Never  Substance Use Topics   Alcohol use: No    Current Outpatient Medications:    chlordiazePOXIDE (LIBRIUM) 25 MG capsule, 50mg  PO TID x 1D, then 25-50mg  PO BID X 1D, then 25-50mg  PO QD X 1D, Disp: 10 capsule, Rfl: 0   levalbuterol (XOPENEX HFA) 45 MCG/ACT inhaler, Inhale 2 puffs into the lungs every 6 (six) hours as needed for wheezing., Disp: 1 Inhaler, Rfl: 11   EPINEPHrine (AUVI-Q) 0.3 mg/0.3 mL IJ SOAJ injection, Inject 0.3 mLs (0.3 mg total) into the muscle once for 1 dose., Disp: 0.3 mL, Rfl: 0  Allergies  Allergen Reactions   Ciprofloxacin Anaphylaxis   Other Anaphylaxis and  Other (See Comments)    Blueberries, tree nuts, honey   Penicillins Anaphylaxis    Objective:   Temp 97.8 F (36.6 C) (Oral)   Ht 5\' 8"  (1.727 m)   Wt 157 lb (71.2 kg)   BMI 23.87 kg/m   Patient is well-developed, well-nourished in no acute distress.  Resting comfortably at home.  Head is normocephalic, atraumatic.  No labored breathing.  Speech is clear and coherent with logical content.  Patient is alert and oriented at baseline.    Assessment and Plan:   Dhillon was seen today for anxiety.  Diagnoses and all orders for this visit:  Anxiety -     Ambulatory referral to Psychiatry  Drug abuse [facility])  Obsessive-compulsive disorder, unspecified type   Continue current therapy. Psychiatry referral placed so that med management can be achieved. Call the office with any questions or concerns.   Leta Jungling, FNP 12/22/2020

## 2022-01-17 IMAGING — CR DG LUMBAR SPINE COMPLETE 4+V
5 series · 5 of 5 positions shown · non-contrast
Comparison: None.

CLINICAL DATA: Back pain.

EXAM:
LUMBAR SPINE - COMPLETE 4+ VIEW

[l-spine ap]
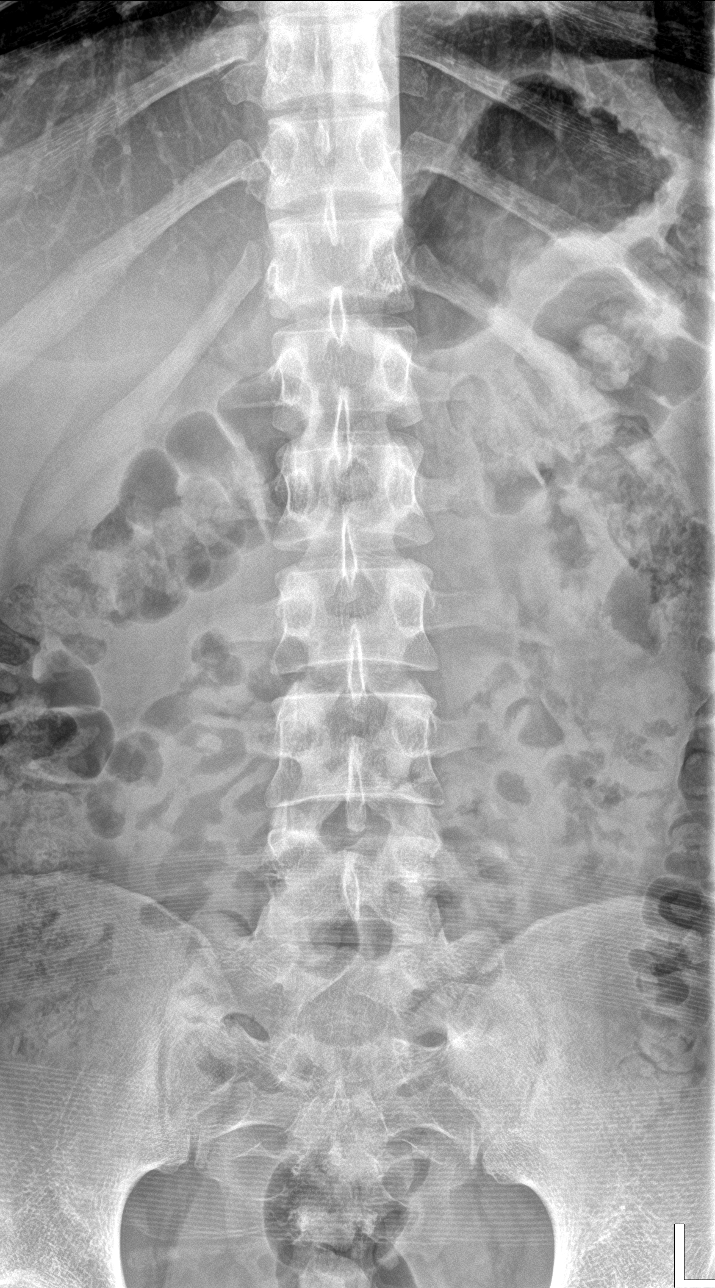

[l-spine obl (1 of 2)]
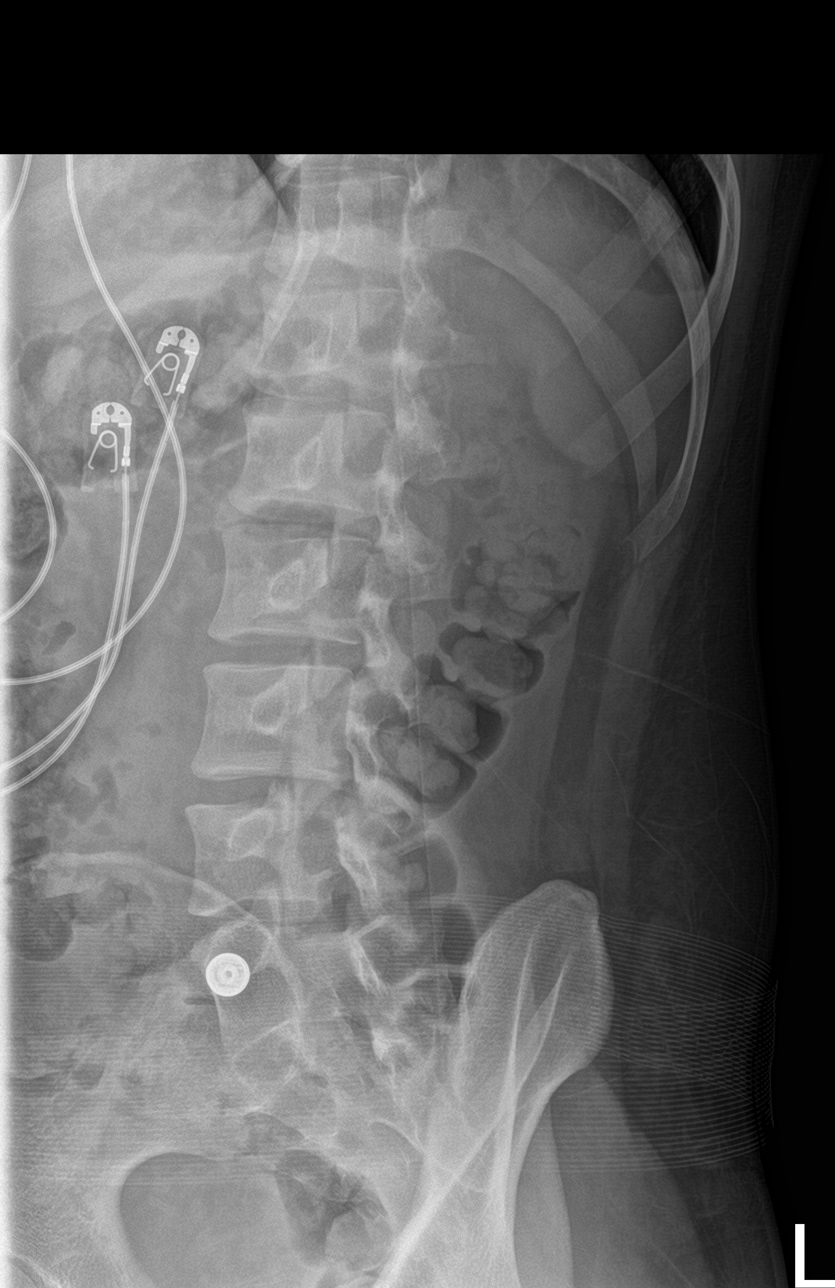

[l-spine obl (2 of 2)]
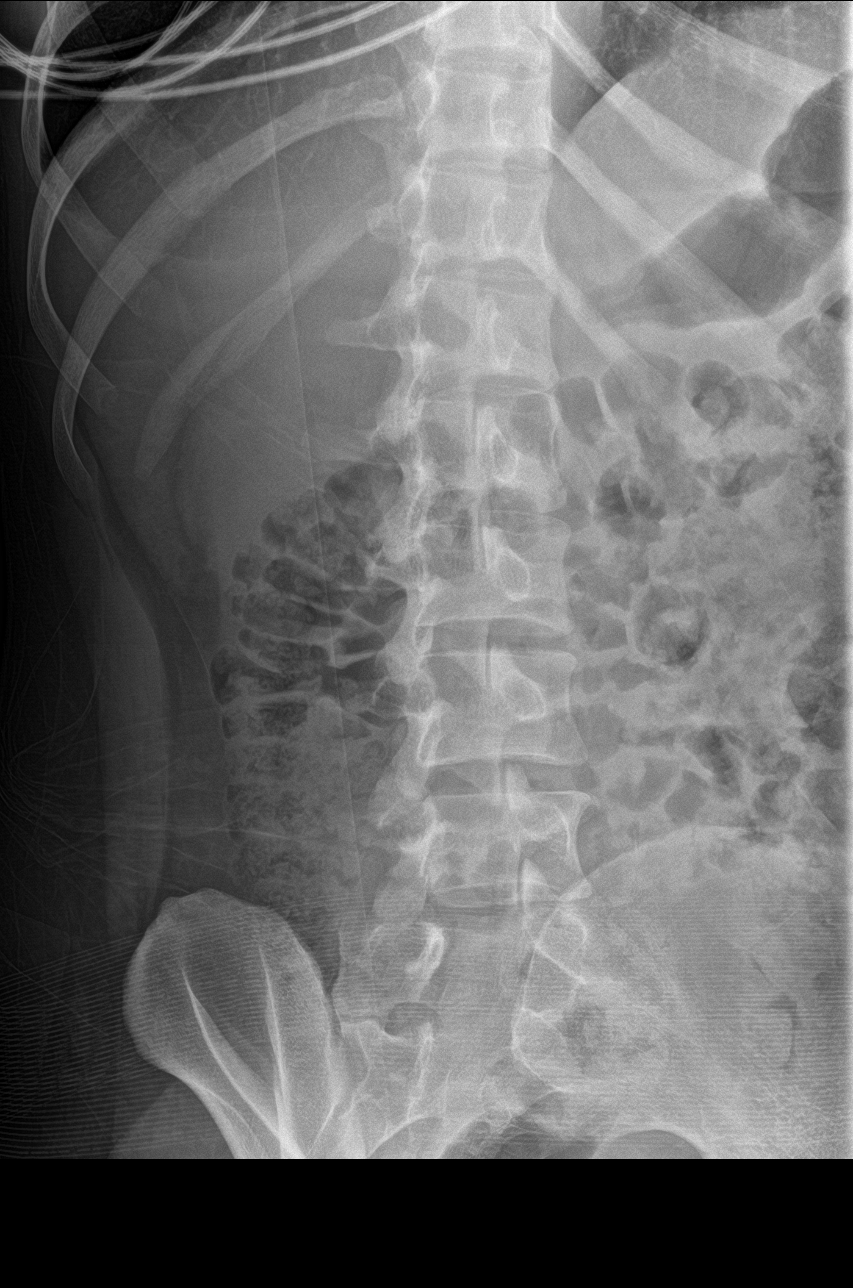

[l-spine lat]
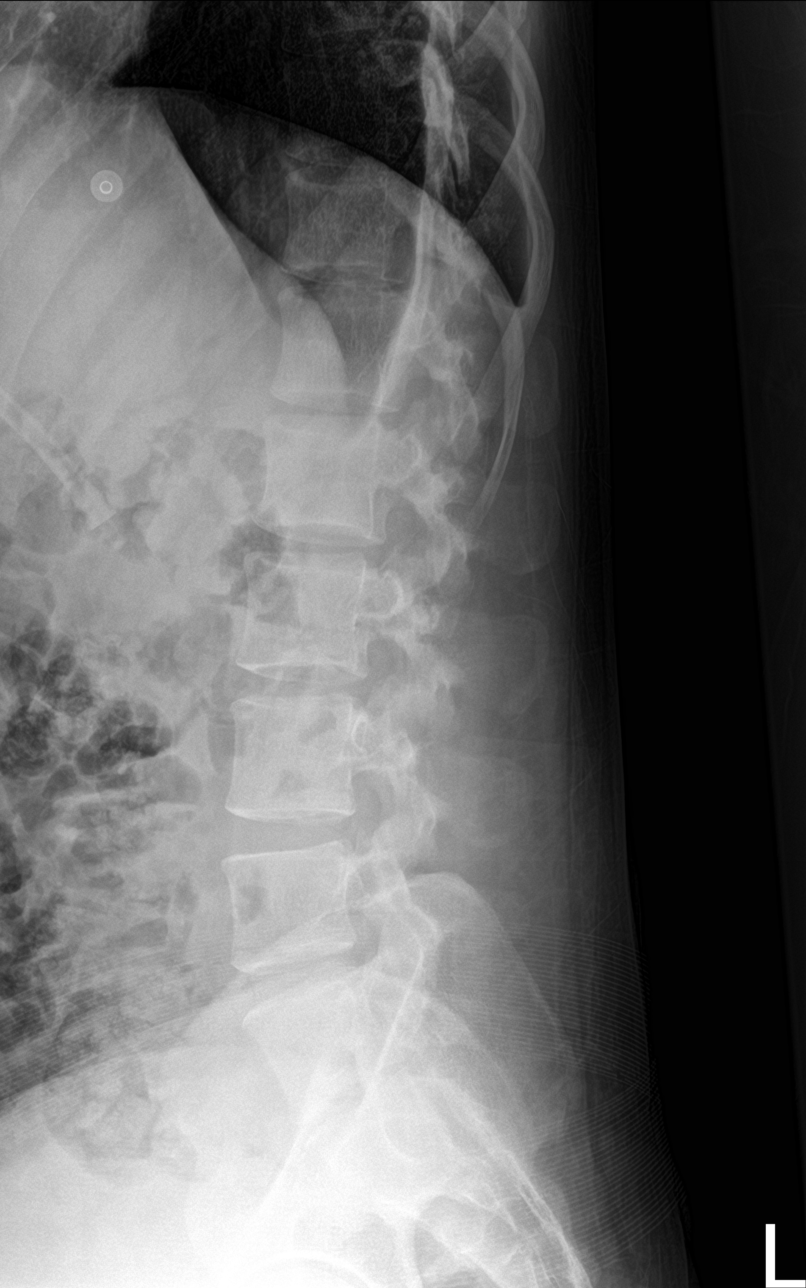

[l-spine spot]
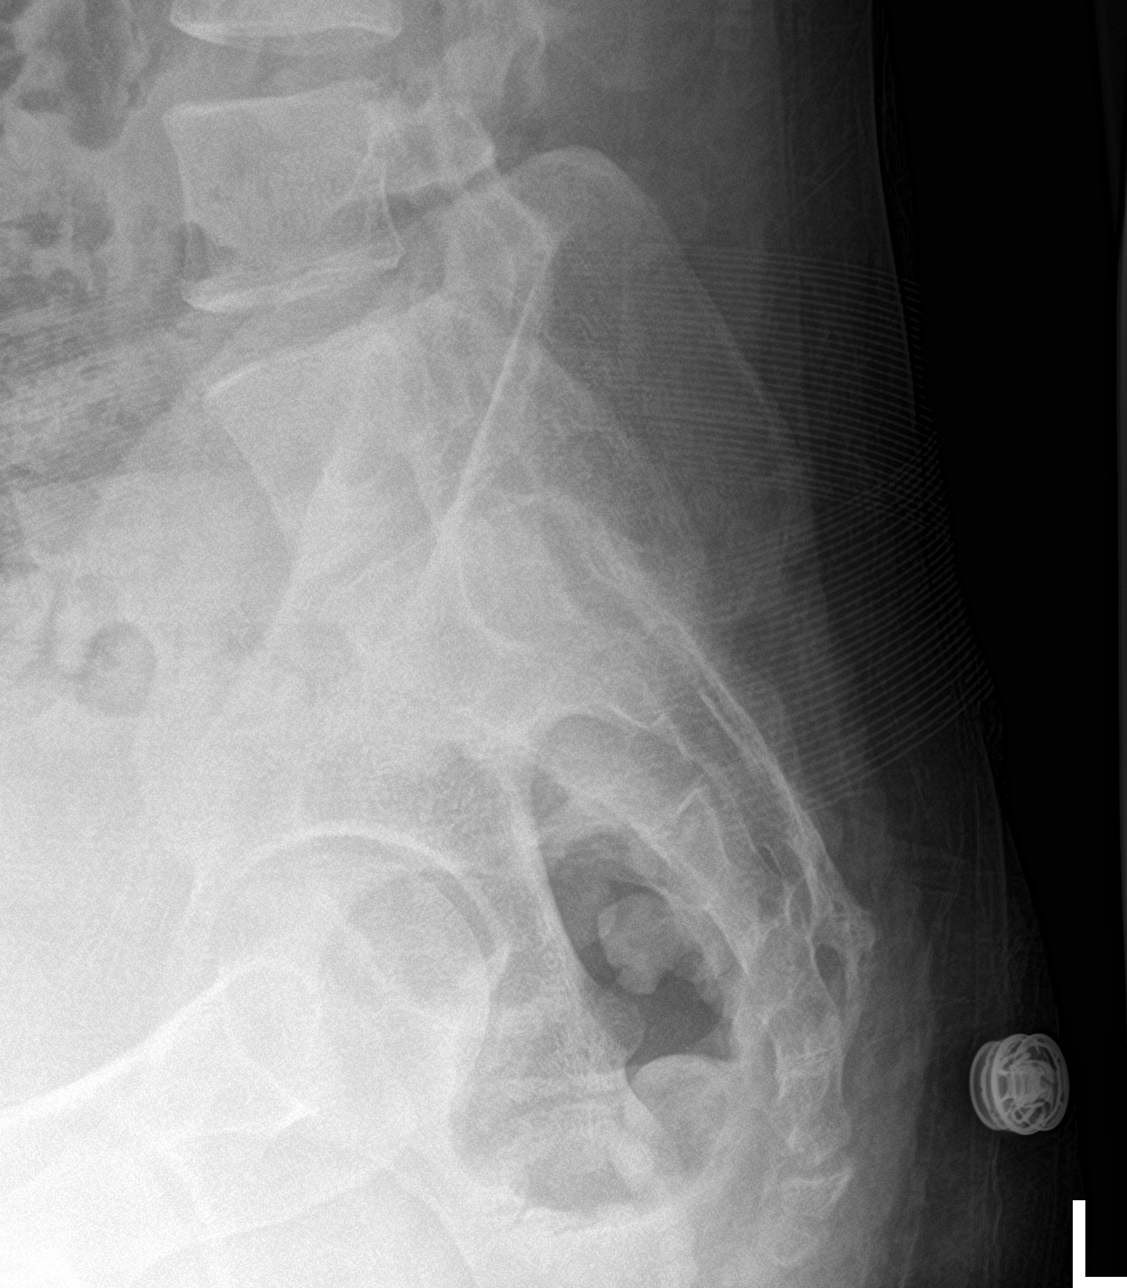

[5 of 5 positions shown; findings below may reference images not displayed]

FINDINGS: There is no evidence of lumbar spine fracture. Alignment is normal.
Intervertebral disc spaces are maintained.
IMPRESSION: Negative.

## 2022-04-27 ENCOUNTER — Encounter: Payer: Self-pay | Admitting: Internal Medicine

## 2022-04-27 ENCOUNTER — Ambulatory Visit: Payer: 59 | Admitting: Internal Medicine

## 2022-04-27 VITALS — BP 109/68 | HR 71 | Temp 98.0°F | Resp 14 | Ht 68.0 in | Wt 135.0 lb

## 2022-04-27 DIAGNOSIS — L708 Other acne: Secondary | ICD-10-CM | POA: Diagnosis not present

## 2022-04-27 DIAGNOSIS — E559 Vitamin D deficiency, unspecified: Secondary | ICD-10-CM

## 2022-04-27 DIAGNOSIS — Z91030 Bee allergy status: Secondary | ICD-10-CM | POA: Diagnosis not present

## 2022-04-27 DIAGNOSIS — F17200 Nicotine dependence, unspecified, uncomplicated: Secondary | ICD-10-CM | POA: Insufficient documentation

## 2022-04-27 DIAGNOSIS — Z880 Allergy status to penicillin: Secondary | ICD-10-CM

## 2022-04-27 DIAGNOSIS — F17218 Nicotine dependence, cigarettes, with other nicotine-induced disorders: Secondary | ICD-10-CM

## 2022-04-27 DIAGNOSIS — J4520 Mild intermittent asthma, uncomplicated: Secondary | ICD-10-CM

## 2022-04-27 DIAGNOSIS — F909 Attention-deficit hyperactivity disorder, unspecified type: Secondary | ICD-10-CM

## 2022-04-27 DIAGNOSIS — F429 Obsessive-compulsive disorder, unspecified: Secondary | ICD-10-CM

## 2022-04-27 MED ORDER — EPINEPHRINE 0.3 MG/0.3ML IJ SOAJ: 0.3000 mg | INTRAMUSCULAR | 11 refills | Status: AC | PRN

## 2022-04-27 MED ORDER — TRETINOIN 0.025 % EX CREA: TOPICAL_CREAM | Freq: Every day | CUTANEOUS | 0 refills | Status: AC

## 2022-04-27 NOTE — Assessment & Plan Note (Signed)
Urged take 2000 units daily of vitamin D which can be purchased over-the-counter

## 2022-04-27 NOTE — Assessment & Plan Note (Addendum)
around on all of the ADHD meds as a kid made him prone to risk of substance use disorder as he got older Declined Strattera intends to use energy drinks and he also declines behavioral health

## 2022-04-27 NOTE — Assessment & Plan Note (Signed)
Stable controlled has not needed his inhaler in a long time

## 2022-04-27 NOTE — Progress Notes (Signed)
Today's healthcare provider: Loralee Pacas, MD  Phone: [phone]  New patient visit  Visit Date: 04/27/2022 Patient: Edward Hardin   DOB: 2000-08-10   21 y.o. Male  MRN: [id number]  Assessment and Plan:   Edward Hardin was seen today for transfer of care.  Bee sting allergy -     EPINEPHrine; Inject 0.3 mg into the muscle as needed for anaphylaxis.  Dispense: 1 each; Refill: 11 -     Ambulatory referral to Allergy  Other acne -     Tretinoin; Apply topically at bedtime.  Dispense: 45 g; Refill: 0  Allergy to amoxicillin  Vitamin D deficiency Overview: He has vitamin D in 2020 of 17 and has not been on supplementation but agreed to start 2000 units at 04/27/2022 appointment Does not drink milk  Assessment & Plan: Edward Hardin take 2000 units daily of vitamin D which can be purchased over-the-counter   Obsessive-compulsive disorder, unspecified type Overview: Its not about germs but he does fixate on like leaves and mailbox is needing to be opened or investigated He declines any medication treatment for it on Zoloft he did not like that Encouraged him to use Poehe does not have his password yet to put on his phone when I meet with the behavioral therapist at this time   Attention deficit hyperactivity disorder (ADHD), unspecified ADHD type Overview: Meds tried: Vyvanse: Headache Focalin-not effective Metadate-not effective Adderall-rebound effects as a child Ritalin LA-did not work.  Was on as a child.  Assessment & Plan: around on all of the ADHD meds as a kid made him prone to risk of substance use disorder as he got older Declined Strattera intends to use energy drinks and he also declines behavioral health   Cigarette nicotine dependence with other nicotine-induced disorder Overview: Has reduced his cigarette smoking down to just 6 cigarettes a day  Assessment & Plan: He reports his desire to quit smoking is 10 of 10 I think it would be really hard for him to quit  though if he tried it would be really painful Clines nicotine patches today but I let him know I am not can give up on him when he keep trying encouraged him to do what he already wants to do   Mild intermittent asthma without complication Overview: Hardly uses his inhaler   Assessment & Plan: Stable controlled has not needed his inhaler in a long time    Health Maintenance  Topic Date Due   TETANUS/TDAP  05/26/2030   HIV Screening  Completed   INFLUENZA VACCINE  Discontinued   HPV VACCINES  Discontinued   COVID-19 Vaccine  Discontinued   Hepatitis C Screening  Discontinued    Recommended follow up: Return in about 1 year (around 04/28/2023) for Annual Exam.  Subjective:  Patient presents today to establish care. Used to follow with Dr. Rogers Hardin Chief Complaint  Patient presents with   Transfer of care    No concerns. Needs new updated prescription for Epipen and refill of tretinoin cream for acne.    For history taking, I took a per problem history from the patient and chart review as follows: Problem  Nicotine Dependence   Has reduced his cigarette smoking down to just 6 cigarettes a day   Vitamin D Deficiency   He has vitamin D in 2020 of 17 and has not been on supplementation but agreed to start 2000 units at 04/27/2022 appointment Does not drink milk   Asthma, Mild Intermittent   Hardly uses his inhaler  Adhd   Meds tried: Vyvanse: Headache Focalin-not effective Metadate-not effective Adderall-rebound effects as a child Ritalin LA-did not work.  Was on as a child.   Ocd (Obsessive Compulsive Disorder)   Its not about germs but he does fixate on like leaves and mailbox is needing to be opened or investigated He declines any medication treatment for it on Zoloft he did not like that Encouraged him to use Poehe does not have his password yet to put on his phone when I meet with the behavioral therapist at this time   Drug Abuse (Hcc) (Resolved)   In rehab.  26 days sober as of 06/17/18. Has done every drug but heroine/meth. Bad trip on LSD prompted parents to to put into private rehab. Discussed will not px BZD or other controlled drugs.    Urethral Obstruction (Resolved)  Anxiety (Resolved)  Voiding Dysfunction (Resolved)   Formatting of this note might be different from the original. Added automatically from request for surgery [id number]   Constipation (Resolved)     Depression Screen    04/27/2022    3:25 PM 12/20/2020    2:38 PM 11/22/2019   11:09 AM 11/21/2019    1:13 PM  PHQ 2/9 Scores  PHQ - 2 Score 0 0 0 0   No results found for any visits on 04/27/22.   The following were reviewed and entered/updated in epic: Past Medical History:  Diagnosis Date   ADHD    Allergy    Anxiety    Depression    Drug abuse ([place]) 06/17/2018   In rehab. 26 days sober as of 06/17/18. Has done every drug but heroine/meth. Bad trip on LSD prompted parents to to put into private rehab. Discussed will not px BZD or other controlled drugs.    Nicotine dependence 04/27/2022   Has reduced his cigarette smoking down to just 6 cigarettes a day   OCD (obsessive compulsive disorder)    Seizures ([place])    Substance abuse ([place])    Urethral obstruction 05/16/2018   Past Surgical History:  Procedure Laterality Date   URETHRA SURGERY     Blocked urethra - no clear cause   Past Surgical History:  Procedure Laterality Date   URETHRA SURGERY     Blocked urethra - no clear cause   Family Status  Relation Name Status   Mother  Alive   Father  Alive   Sister Edward Hardin Alive   MGM  Alive   MGF  Alive   PGM  Alive   PGF  Alive   Family History  Problem Relation Age of Onset   Asthma Mother    Asthma Sister    Asthma Maternal Grandmother    Ovarian cancer Maternal Grandmother    Hearing loss Maternal Grandfather    Diabetes Mellitus II Maternal Grandfather    Hypertension Paternal Grandfather    Hyperlipidemia Paternal Grandfather    Outpatient  Medications Prior to Visit  Medication Sig Dispense Refill   chlordiazePOXIDE (LIBRIUM) 25 MG capsule 36m PO TID x 1D, then 25-556mPO BID X 1D, then 25-5052mO QD X 1D (Patient not taking: Reported on 04/27/2022) 10 capsule 0   levalbuterol (XOPENEX HFA) 45 MCG/ACT inhaler Inhale 2 puffs into the lungs every 6 (six) hours as needed for wheezing. (Patient not taking: Reported on 04/27/2022) 1 Inhaler 11   EPINEPHrine (AUVI-Q) 0.3 mg/0.3 mL IJ SOAJ injection Inject 0.3 mLs (0.3 mg total) into the muscle once for 1 dose. 0.3 mL 0  No facility-administered medications prior to visit.    Allergies  Allergen Reactions   Ciprofloxacin Anaphylaxis   Penicillins Anaphylaxis   Social History   Tobacco Use   Smoking status: Every Day    Types: Cigarettes   Smokeless tobacco: Never  Vaping Use   Vaping Use: Every day  Substance Use Topics   Alcohol use: No   Drug use: Not Currently    Types: Marijuana, LSD    Immunization History  Administered Date(s) Administered   DTaP 07/07/2001, 09/05/2001, 05/15/2002, 02/28/2007, 04/29/2019   HIB (PRP-OMP) 04/28/2001, 07/07/2001, 02/06/2002   Hepatitis B 07/07/2001, 02/06/2002, 04/29/2019   IPV 04/28/2001, 07/07/2001, 02/06/2002, 02/28/2007   Influenza,inj,Quad PF,6+ Mos 06/11/2017, 04/16/2018   Influenza-Unspecified 06/22/2016, 04/16/2018   MMR 07/03/2002, 02/28/2007   Meningococcal B, OMV 07/05/2019, 07/05/2019   Meningococcal Conjugate 02/08/2014   Pneumococcal Conjugate-13 04/28/2001, 07/07/2001, 09/05/2001   Tdap 02/08/2012, 05/26/2020   Varicella 02/06/2002      Objective:  BP 109/68 (BP Location: Left Arm, Patient Position: Sitting)   Pulse 71   Temp 98 F (36.7 C) (Temporal)   Resp 14   Ht _0  (1.727 m)   Wt 135 lb (61.2 kg) Comment: patient stated, refused to get on scale  SpO2 98%   BMI 20.53 kg/m  Body mass index is 20.53 kg/m.  He  is a very cordial and polite person who was a pleasure to meet. Perfect skin.  Red  erythmatous patch r neck ? Birth mark.  Gen: NAD, resting comfortably HEENT: Mucous membranes are moist. Sclera conjunctiva and lids grossly normal Neck: no thyromegaly, no cervical lymphadenopathy Abdomen: soft/nontender/nondistended. No rebound or guarding.  Ext: no edema Skin: warm, dry Neuro: grossly intact  Results for orders placed or performed during the hospital encounter of 51/88/41  Basic metabolic panel  Result Value Ref Range   Sodium 138 135 - 145 mmol/L   Potassium 4.5 3.5 - 5.1 mmol/L   Chloride 106 98 - 111 mmol/L   CO2 23 22 - 32 mmol/L   Glucose, Bld 115 (H) 70 - 99 mg/dL   BUN 14 6 - 20 mg/dL   Creatinine, Ser 0.82 0.61 - 1.24 mg/dL   Calcium 9.3 8.9 - 10.3 mg/dL   GFR, Estimated >60 >60 mL/min   Anion gap 9 5 - 15  CBC WITH DIFFERENTIAL  Result Value Ref Range   WBC 7.1 4.0 - 10.5 K/uL   RBC 5.56 4.22 - 5.81 MIL/uL   Hemoglobin 16.8 13.0 - 17.0 g/dL   HCT 49.2 39.0 - 52.0 %   MCV 88.5 80.0 - 100.0 fL   MCH 30.2 26.0 - 34.0 pg   MCHC 34.1 30.0 - 36.0 g/dL   RDW 11.9 11.5 - 15.5 %   Platelets 278 150 - 400 K/uL   nRBC 0.0 0.0 - 0.2 %   Neutrophils Relative % 77 %   Neutro Abs 5.3 1.7 - 7.7 K/uL   Lymphocytes Relative 18 %   Lymphs Abs 1.3 0.7 - 4.0 K/uL   Monocytes Relative 5 %   Monocytes Absolute 0.4 0.1 - 1.0 K/uL   Eosinophils Relative 0 %   Eosinophils Absolute 0.0 0.0 - 0.5 K/uL   Basophils Relative 0 %   Basophils Absolute 0.0 0.0 - 0.1 K/uL   Immature Granulocytes 0 %   Abs Immature Granulocytes 0.03 0.00 - 0.07 K/uL  Ethanol/ETOH  Result Value Ref Range   Alcohol, Ethyl (B) <10 <10 mg/dL  CBG monitoring, ED  Result Value  Ref Range   Glucose-Capillary 89 70 - 99 mg/dL   Comment 1 Notify RN    Comment 2 Document in Chart

## 2022-04-27 NOTE — Assessment & Plan Note (Signed)
He reports his desire to quit smoking is 10 of 10 I think it would be really hard for him to quit though if he tried it would be really painful Clines nicotine patches today but I let him know I am not can give up on him when he keep trying encouraged him to do what he already wants to do

## 2022-04-27 NOTE — Patient Instructions (Addendum)
It was a pleasure seeing you today!  Today the plan is...  Bee sting allergy -     EPINEPHrine; Inject 0.3 mg into the muscle as needed for anaphylaxis.  Dispense: 1 each; Refill: 11 -     Ambulatory referral to Allergy  Other acne -     Tretinoin; Apply topically at bedtime.  Dispense: 45 g; Refill: 0  Allergy to amoxicillin  Vitamin D deficiency Assessment & Plan: Urged take 2000 units daily of vitamin D which can be purchased over-the-counter   Obsessive-compulsive disorder, unspecified type  Attention deficit hyperactivity disorder (ADHD), unspecified ADHD type Assessment & Plan: around on all of the ADHD meds as a kid made him prone to risk of substance use disorder as he got older Declined Strattera intends to use energy drinks and he also declines behavioral health   Cigarette nicotine dependence with other nicotine-induced disorder Assessment & Plan: He reports his desire to quit smoking is 10 of 10 I think it would be really hard for him to quit though if he tried it would be really painful Clines nicotine patches today but I let him know I am not can give up on him when he keep trying encouraged him to do what he already wants to do   Mild intermittent asthma without complication Assessment & Plan: Stable controlled has not needed his inhaler in a long time      Loralee Pacas, MD   Return in about 1 year (around 04/28/2023) for Annual Exam.   - If your condition fails to resolve or you have other questions / concerns: please contact me via phone [phone] or MyChart messaging.  - Please bring all your medicines to your next appointment. This is the best way for me to know exactly what you're taking.  - If your condition begins to worsen or become severe:  go to the ER.   IF you received an x-ray today, you will receive an invoice from [facility]. Please contact [facility] at [phone] with questions or concerns regarding your  invoice.    IF you received labwork today, you will receive an invoice from [facility]. Please contact [facility] at [phone] with questions or concerns regarding your invoice.    Our billing staff will not be able to assist you with questions regarding bills from these companies.   --------------------------------------------------------------------------------------------------------------------  You will be contacted with the lab results as soon as they are available. The fastest way to get your results is to activate your My Chart account. Instructions are located on the last page of this paperwork. If you have not heard from [place] regarding the results in 2 weeks, please contact this office. For any labs or imaging tests, we will call you if the results are significantly abnormal.  Most normal results will be posted to myChart as soon as they are available and I will comment on them there within 2-3 business days.

## 2023-04-29 ENCOUNTER — Encounter: Payer: 59 | Admitting: Internal Medicine

## 2024-01-14 ENCOUNTER — Telehealth: Payer: Self-pay

## 2024-01-14 NOTE — Transitions of Care (Post Inpatient/ED Visit) (Unsigned)
   01/14/2024  Name: Edward Hardin MRN: [id number] DOB: [birth year]  Today's TOC FU Call Status: Today's TOC FU Call Status:: Unsuccessful Call (1st Attempt) Unsuccessful Call (1st Attempt) Date: 01/14/24  Attempted to reach the patient regarding the most recent Inpatient/ED visit.  Follow Up Plan: Additional outreach attempts will be made to reach the patient to complete the Transitions of Care (Post Inpatient/ED visit) call.   Signature Julian Lemmings, LPN [facility] Nurse Health Advisor Direct Dial [phone]

## 2024-01-17 NOTE — Transitions of Care (Post Inpatient/ED Visit) (Unsigned)
   01/17/2024  Name: Edward Hardin MRN: [id number] DOB: [birth year]  Today's TOC FU Call Status: Today's TOC FU Call Status:: Unsuccessful Call (2nd Attempt) Unsuccessful Call (1st Attempt) Date: 01/14/24 Unsuccessful Call (2nd Attempt) Date: 01/17/24  Attempted to reach the patient regarding the most recent Inpatient/ED visit.  Follow Up Plan: Additional outreach attempts will be made to reach the patient to complete the Transitions of Care (Post Inpatient/ED visit) call.   Signature Julian Lemmings, LPN [facility] Nurse Health Advisor Direct Dial [phone]

## 2024-01-18 NOTE — Transitions of Care (Post Inpatient/ED Visit) (Signed)
   01/18/2024  Name: Edward Hardin MRN: [id number] DOB: 11/14/2000  Today's TOC FU Call Status: Today's TOC FU Call Status:: Unsuccessful Call (3rd Attempt) Unsuccessful Call (1st Attempt) Date: 01/14/24 Unsuccessful Call (2nd Attempt) Date: 01/17/24 Unsuccessful Call (3rd Attempt) Date: 01/18/24  Attempted to reach the patient regarding the most recent Inpatient/ED visit.  Follow Up Plan: No further outreach attempts will be made at this time. We have been unable to contact the patient.  Signature Julian Lemmings, LPN [facility] Nurse Health Advisor Direct Dial [phone]
